# Patient Record
Sex: Male | Born: 1990 | Race: Black or African American | Hispanic: No | Marital: Single | Smoking: Never smoker
Health system: Southern US, Community
[De-identification: ages and names within clinical notes are randomized; demographics above are authoritative.]

## PROBLEM LIST (undated history)

## (undated) DIAGNOSIS — E781 Pure hyperglyceridemia: Secondary | ICD-10-CM

## (undated) DIAGNOSIS — E785 Hyperlipidemia, unspecified: Secondary | ICD-10-CM

## (undated) HISTORY — DX: Pure hyperglyceridemia: E78.1

## (undated) HISTORY — DX: Hyperlipidemia, unspecified: E78.5

---

## 2018-06-08 ENCOUNTER — Emergency Department (HOSPITAL_COMMUNITY): Payer: BLUE CROSS/BLUE SHIELD

## 2018-06-08 ENCOUNTER — Encounter (HOSPITAL_COMMUNITY): Payer: Self-pay | Admitting: Emergency Medicine

## 2018-06-08 ENCOUNTER — Other Ambulatory Visit: Payer: Self-pay

## 2018-06-08 ENCOUNTER — Emergency Department (HOSPITAL_COMMUNITY)
Admission: EM | Admit: 2018-06-08 | Discharge: 2018-06-08 | Disposition: A | Discharge status: Home / Self Care | Payer: BLUE CROSS/BLUE SHIELD | Attending: Emergency Medicine | Admitting: Emergency Medicine

## 2018-06-08 DIAGNOSIS — J111 Influenza due to unidentified influenza virus with other respiratory manifestations: Secondary | ICD-10-CM | POA: Insufficient documentation

## 2018-06-08 DIAGNOSIS — R69 Illness, unspecified: Secondary | ICD-10-CM

## 2018-06-08 DIAGNOSIS — R509 Fever, unspecified: Secondary | ICD-10-CM | POA: Diagnosis present

## 2018-06-08 MED ORDER — ACETAMINOPHEN 325 MG PO TABS
650.0000 mg | ORAL_TABLET | Freq: Once | ORAL | Status: AC
  Administered 2018-06-08: 650 mg via ORAL
  Filled 2018-06-08: qty 2

## 2018-06-08 NOTE — ED Notes (Signed)
Patient transported to X-ray 

## 2018-06-08 NOTE — ED Provider Notes (Signed)
[facility] EMERGENCY DEPARTMENT Provider Note   CSN: [id number] Arrival date & time: 06/08/18  1340    History   Chief Complaint Chief Complaint  Patient presents with  . Fever  . Generalized Body Aches    HPI Chris Rodriguez Chris Rodriguez is a 28 y.o. male.     The history is provided by the patient. No language interpreter was used.  Fever   Chris Rodriguez Chris Rodriguez is a 28 y.o. male who presents to the Emergency Department complaining of fever, body aches. Presents to the emergency department for evaluation of fever, body aches, headache and cough that began on Tuesday of this week. He has subjective fevers, nonproductive cough. He has taken ibuprofen as well as an over-the-counter cold medication with mild improvement in his symptoms. He has no known sick contacts. He did recently travel to [place], but no foreign travel. He has not been around people that have recently had international travel. He denies any nausea, vomiting, diarrhea. He has no medical problems. Symptoms are mild to moderate and constant nature. History reviewed. No pertinent past medical history.  There are no active problems to display for this patient.   History reviewed. No pertinent surgical history.      Home Medications    Prior to Admission medications   Not on File    Family History No family history on file.  Social History Social History   Tobacco Use  . Smoking status: Never Smoker  . Smokeless tobacco: Never Used  Substance Use Topics  . Alcohol use: Never    Frequency: Never  . Drug use: Never     Allergies   Patient has no known allergies.   Review of Systems Review of Systems  Constitutional: Positive for fever.  All other systems reviewed and are negative.    Physical Exam Updated Vital Signs BP 125/86 (BP Location: Right Arm)   Pulse (!) 120   Temp 99.6 F (37.6 C) (Oral)   Resp 20   SpO2 100%   Physical Exam Vitals signs  and nursing note reviewed.  Constitutional:      Appearance: He is well-developed.  HENT:     Head: Normocephalic and atraumatic.     Mouth/Throat:     Mouth: Mucous membranes are moist.     Pharynx: No posterior oropharyngeal erythema.  Cardiovascular:     Rate and Rhythm: Regular rhythm.     Heart sounds: No murmur.     Comments: Tachycardic Pulmonary:     Effort: Pulmonary effort is normal. No respiratory distress.     Breath sounds: Normal breath sounds.  Abdominal:     Palpations: Abdomen is soft.     Tenderness: There is no abdominal tenderness. There is no guarding or rebound.  Musculoskeletal:        General: No tenderness.  Skin:    General: Skin is warm and dry.  Neurological:     Mental Status: He is alert and oriented to person, place, and time.  Psychiatric:        Behavior: Behavior normal.      ED Treatments / Results  Labs (all labs ordered are listed, but only abnormal results are displayed) Labs Reviewed - No data to display  EKG None  Radiology Dg Chest 2 View  Result Date: 06/08/2018 CLINICAL DATA:  Cough and fever EXAM: CHEST - 2 VIEW COMPARISON:  None. FINDINGS: The heart size and mediastinal contours are within normal limits. Both lungs are clear.  The visualized skeletal structures are unremarkable. IMPRESSION: No active cardiopulmonary disease. Electronically Signed   By: Jasmine Pang M.D.   On: 06/08/2018 14:51    Procedures Procedures (including critical care time)  Medications Ordered in ED Medications  acetaminophen (TYLENOL) tablet 650 mg (650 mg Oral Given 06/08/18 1419)     Initial Impression / Assessment and Plan / ED Course  I have reviewed the triage vital signs and the nursing notes.  Pertinent labs & imaging results that were available during my care of the patient were reviewed by me and considered in my medical decision making (see chart for details).        Patient here for evaluation of fever, cough, headache. He is  non-toxic appearing on evaluation with no respiratory distress. Presentation is not consistent with pneumonia, meningitis. Discussed with patient home care for viral illness, symptom management with over-the-counter medications. Discussed outpatient follow-up and return precautions.  Final Clinical Impressions(s) / ED Diagnoses   Final diagnoses:  Influenza-like illness    ED Discharge Orders    None       Tilden Fossa, MD 06/08/18 1505

## 2018-06-08 NOTE — ED Triage Notes (Signed)
Patient reports fevers/chills, generalized body aches, fatigue, h/a, non-productive cough x 3 days. Last took ibuprofen last night. Denies being around anyone sick, no N/V/D, denies shortness of breath or chest pain.

## 2018-06-08 NOTE — ED Notes (Signed)
Pt has returned to room.

## 2019-08-01 ENCOUNTER — Other Ambulatory Visit: Payer: Self-pay

## 2019-08-01 ENCOUNTER — Ambulatory Visit: Payer: BLUE CROSS/BLUE SHIELD | Attending: Internal Medicine

## 2019-08-01 DIAGNOSIS — Z20822 Contact with and (suspected) exposure to covid-19: Secondary | ICD-10-CM

## 2019-08-02 LAB — SARS-COV-2, NAA 2 DAY TAT

## 2019-08-02 LAB — NOVEL CORONAVIRUS, NAA: SARS-CoV-2, NAA: NOT DETECTED

## 2019-08-06 ENCOUNTER — Ambulatory Visit: Payer: BLUE CROSS/BLUE SHIELD | Attending: Internal Medicine

## 2019-08-06 DIAGNOSIS — Z20822 Contact with and (suspected) exposure to covid-19: Secondary | ICD-10-CM

## 2019-08-07 LAB — NOVEL CORONAVIRUS, NAA: SARS-CoV-2, NAA: NOT DETECTED

## 2019-08-07 LAB — SARS-COV-2, NAA 2 DAY TAT

## 2019-10-04 ENCOUNTER — Ambulatory Visit: Payer: BLUE CROSS/BLUE SHIELD | Attending: Internal Medicine

## 2019-10-04 DIAGNOSIS — Z23 Encounter for immunization: Secondary | ICD-10-CM

## 2019-10-04 NOTE — Progress Notes (Signed)
   Covid-19 Vaccination Clinic  Name:  Chris Rodriguez    MRN: [id number] DOB: 1990-08-27  10/04/2019  [name] was observed post Covid-19 immunization for 15 minutes without incident. He was provided with Vaccine Information Sheet and instruction to access the V-Safe system.   [name] was instructed to call 911 with any severe reactions post vaccine: Marland Kitchen Difficulty breathing  . Swelling of face and throat  . A fast heartbeat  . A bad rash all over body  . Dizziness and weakness   Immunizations Administered    Name Date Dose VIS Date Route   Pfizer COVID-19 Vaccine 10/04/2019 10:08 AM 0.3 mL 05/30/2018 Intramuscular   Manufacturer: [employer], [employer]   Lot: [id number]   NDC: [address]-6

## 2019-10-15 IMAGING — CR DG CHEST 2V
2 series · 2 of 2 positions shown · non-contrast
Comparison: None.

CLINICAL DATA: Cough and fever

EXAM:
CHEST - 2 VIEW

[chest pa]
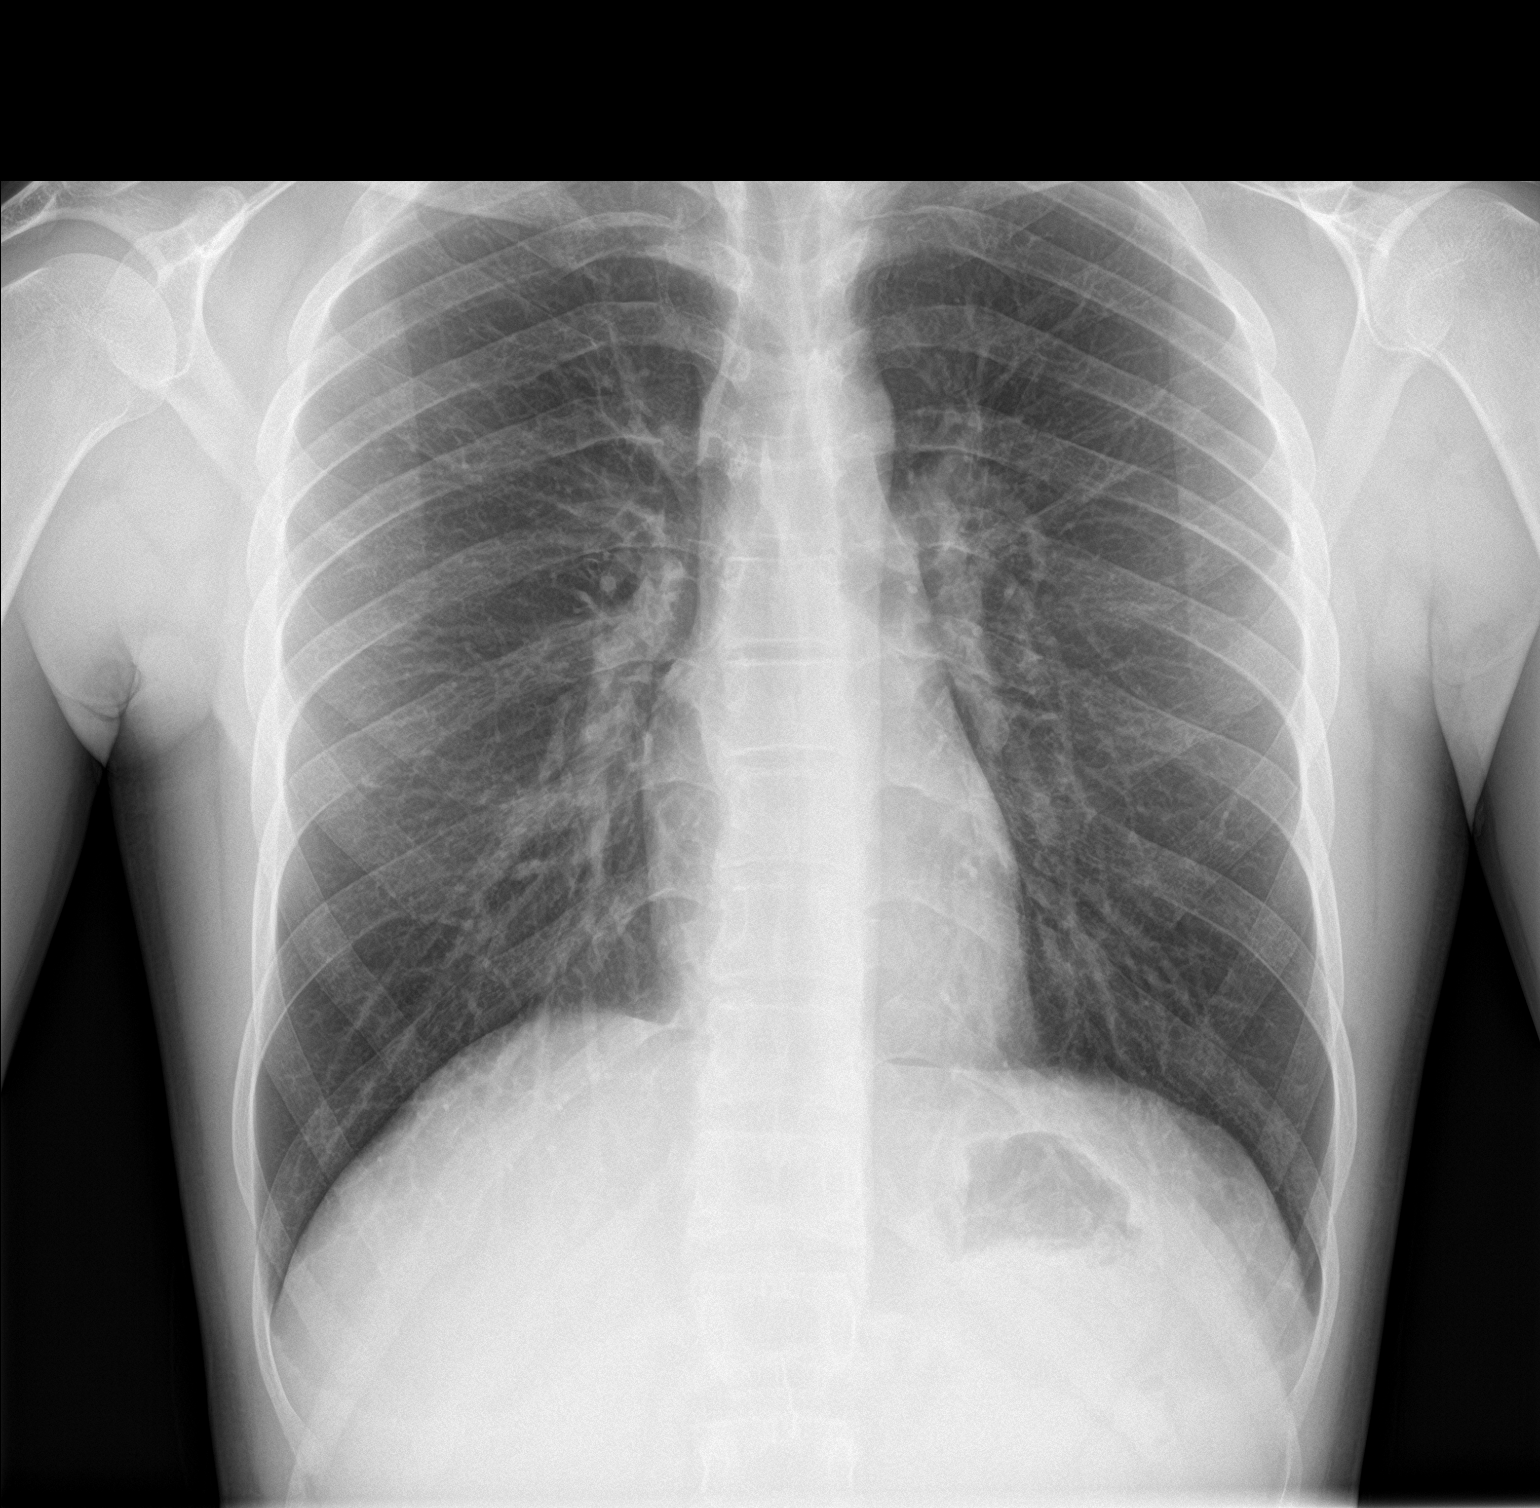

[chest lat]
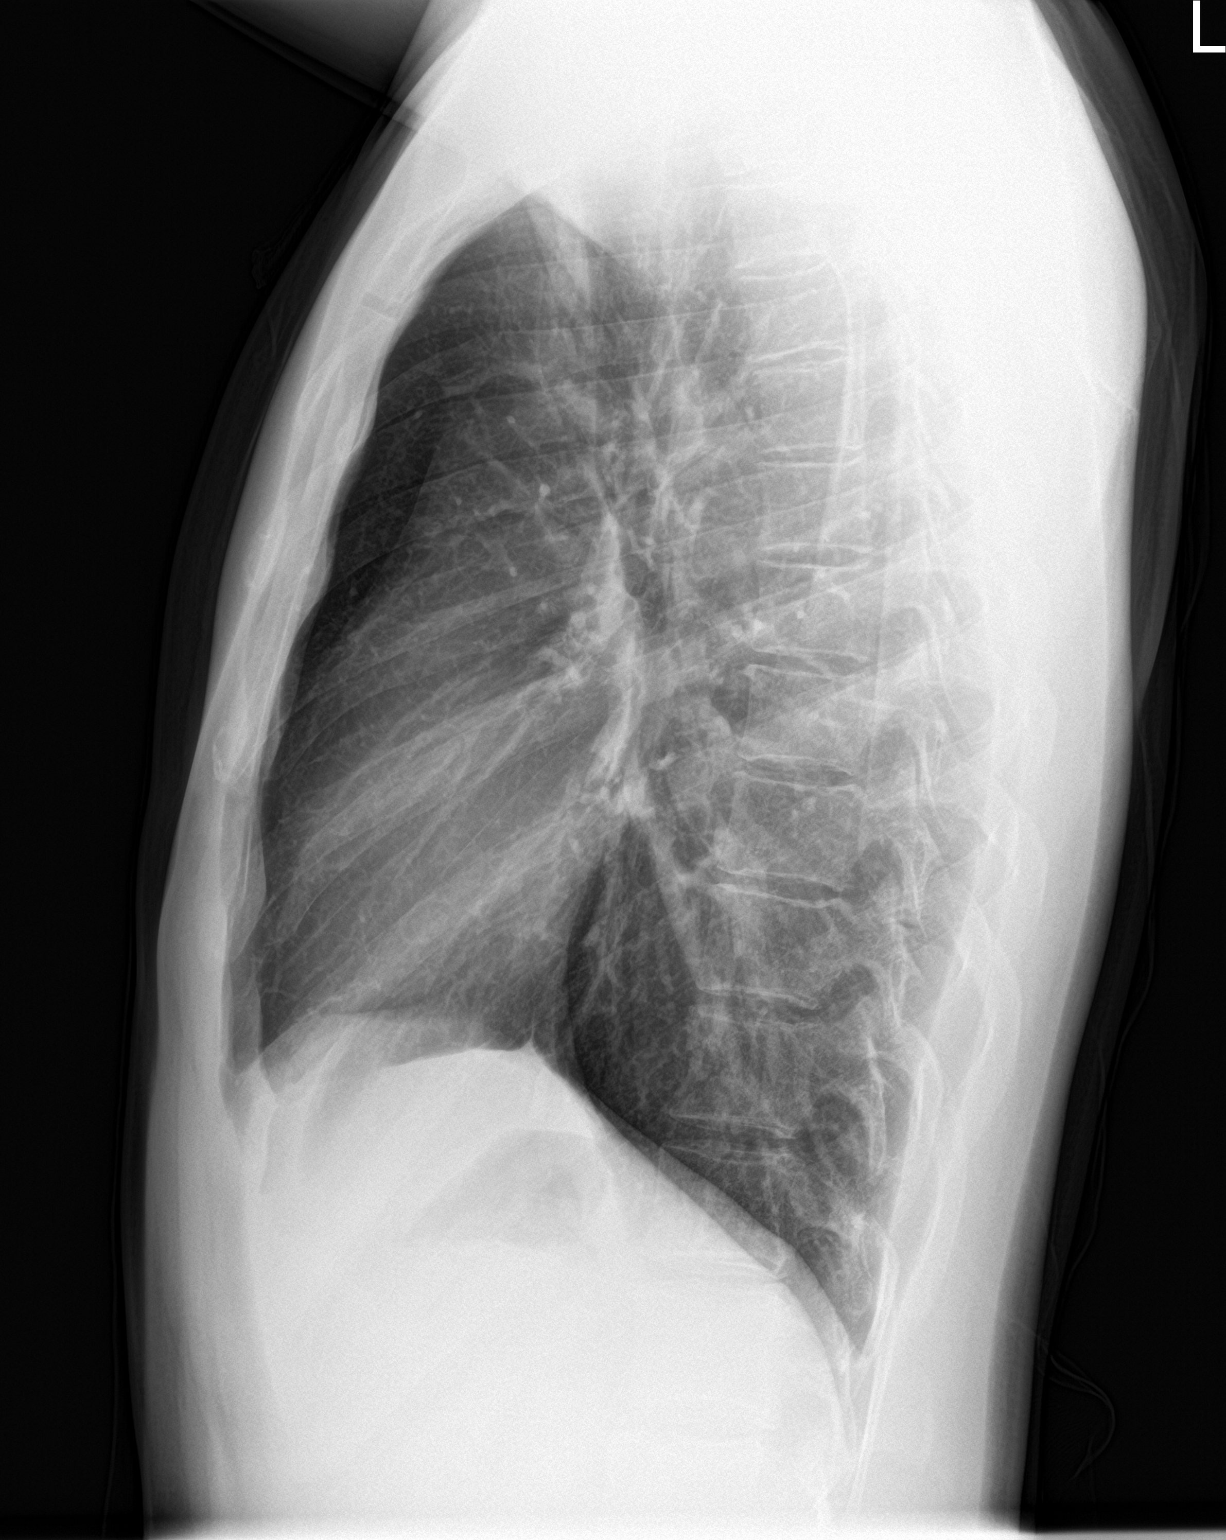

[2 of 2 positions shown; findings below may reference images not displayed]

FINDINGS: The heart size and mediastinal contours are within normal limits.
Both lungs are clear. The visualized skeletal structures are
unremarkable.
IMPRESSION: No active cardiopulmonary disease.

## 2019-10-29 ENCOUNTER — Ambulatory Visit: Payer: BLUE CROSS/BLUE SHIELD

## 2019-10-29 ENCOUNTER — Ambulatory Visit: Payer: BLUE CROSS/BLUE SHIELD | Attending: Internal Medicine

## 2019-10-29 DIAGNOSIS — Z23 Encounter for immunization: Secondary | ICD-10-CM

## 2019-10-29 NOTE — Progress Notes (Signed)
   Covid-19 Vaccination Clinic  Name:  Mubashir Mallek    MRN: [id number] DOB: 1991-04-05  10/29/2019  Mr. Simmering was observed post Covid-19 immunization for 15 minutes without incident. He was provided with Vaccine Information Sheet and instruction to access the V-Safe system.   Mr. Cormier was instructed to call 911 with any severe reactions post vaccine: Marland Kitchen Difficulty breathing  . Swelling of face and throat  . A fast heartbeat  . A bad rash all over body  . Dizziness and weakness   Immunizations Administered    Name Date Dose VIS Date Route   Pfizer COVID-19 Vaccine 10/29/2019 11:54 AM 0.3 mL 05/30/2018 Intramuscular   Manufacturer: [employer], [employer]   Lot: [id number]   NDC: [address]-2

## 2020-04-10 ENCOUNTER — Ambulatory Visit: Payer: Self-pay | Attending: Internal Medicine

## 2020-04-10 DIAGNOSIS — Z23 Encounter for immunization: Secondary | ICD-10-CM

## 2020-04-10 NOTE — Progress Notes (Signed)
   Covid-19 Vaccination Clinic  Name:  Chris Rodriguez    MRN: [id number] DOB: July 17, 1990  04/10/2020  [name] was observed post Covid-19 immunization for 15 minutes without incident. He was provided with Vaccine Information Sheet and instruction to access the V-Safe system.   [name] was instructed to call 911 with any severe reactions post vaccine: Marland Kitchen Difficulty breathing  . Swelling of face and throat  . A fast heartbeat  . A bad rash all over body  . Dizziness and weakness   Immunizations Administered    Name Date Dose VIS Date Route   Pfizer COVID-19 Vaccine 04/10/2020  3:52 PM 0.3 mL 01/23/2020 Intramuscular   Manufacturer: [employer], [employer]   Lot: [id number]   NDC: [address]-1

## 2020-04-18 ENCOUNTER — Other Ambulatory Visit: Payer: Self-pay

## 2021-06-11 ENCOUNTER — Ambulatory Visit: Payer: 59 | Admitting: Cardiology

## 2021-06-11 ENCOUNTER — Other Ambulatory Visit: Payer: Self-pay

## 2021-06-11 ENCOUNTER — Encounter: Payer: Self-pay | Admitting: Cardiology

## 2021-06-11 ENCOUNTER — Inpatient Hospital Stay: Payer: 59

## 2021-06-11 VITALS — BP 123/79 | HR 93 | Temp 97.3°F | Resp 16 | Ht 75.0 in | Wt 161.4 lb

## 2021-06-11 DIAGNOSIS — R002 Palpitations: Secondary | ICD-10-CM

## 2021-06-11 DIAGNOSIS — R9431 Abnormal electrocardiogram [ECG] [EKG]: Secondary | ICD-10-CM

## 2021-06-11 DIAGNOSIS — E781 Pure hyperglyceridemia: Secondary | ICD-10-CM

## 2021-06-11 NOTE — Progress Notes (Signed)
Date:  06/11/2021   ID:  Chris Rodriguez, [place] 08/12/1990, MRN [id number]  Chris Rodriguez:  Chris Rodriguez, No  Cardiologist:  Chris LernerSunit Jossette Zirbel, DO, [facility] (established care 06/11/2021)  REASON FOR CONSULT: Dizziness/giddiness, abnormal EKG  REQUESTING PHYSICIAN:  Chris Rodriguez, [name], MD [id number] [place],  [place] [id number]  Chief Complaint  Patient presents with   Abnormal ECG   Dizziness   New Patient (Initial Visit)    HPI  Chris Rodriguez Chris Rodriguez is a 31 y.o. [place] male who presents to the office with a chief complaint of "dizziness and abnormal EKG." Patient's past medical history and cardiovascular risk factors include: Hypertriglyceridemia, hyperlipidemia.  He is referred to the office at the request of Chris Rodriguez, Chris StallionGeorge, MD for evaluation of dizziness/giddiness, abnormal EKG.  Palpitations:  Palpitations have been present for the last 2 years, occurs at least once a week, more noticeable with long distance driving, duration less than a minute, self-limited.  At times better with deep breathing exercises, no worsening factors.  He consumes 1 cup of coffee per day and 2 cups of tea.  He denies use of sodas, illicit drugs, energy drinks, over-the-counter supplements, herbs, weight loss supplements or other stimulants.  Dizziness: This also occurs once a week, no associated near-syncope or syncopal event.  More noticeable with changing positions.  No improving or worsening factors.  Patient's cholesterol levels are not well controlled.  Upon further questioning patient states that he has been eating out a lot over the last 1 year.  No structured exercise program or daily routine. No history of rheumatic fever.   No family history of premature coronary disease or sudden cardiac death.  FUNCTIONAL STATUS: No structured exercise program or daily routine.   ALLERGIES: No Known Allergies  MEDICATION LIST PRIOR TO VISIT: Current Meds  Medication Sig   Cyanocobalamin  (VITAMIN B 12 PO) Take 1 capsule by mouth as needed.   Omega-3 Fatty Acids (OMEGA-3 FISH OIL PO) Take 1 capsule by mouth daily as needed.     PAST MEDICAL HISTORY: Past Medical History:  Diagnosis Date   Hyperlipidemia    Hypertriglyceridemia     PAST SURGICAL HISTORY: History reviewed. No pertinent surgical history.  FAMILY HISTORY: No family history of premature coronary disease or sudden cardiac death.  SOCIAL HISTORY:  The patient  reports that he has never smoked. He has never used smokeless tobacco. He reports that he does not drink alcohol and does not use drugs.  REVIEW OF SYSTEMS: Review of Systems  Cardiovascular:  Positive for palpitations. Negative for chest pain, claudication, dyspnea on exertion, leg swelling, orthopnea, paroxysmal nocturnal dyspnea and syncope.  Respiratory:  Negative for shortness of breath.   Neurological:  Positive for dizziness and light-headedness.   PHYSICAL EXAM: Vitals with BMI 06/11/2021 06/08/2018 06/08/2018  Height 6\' 3"  - -  Weight 161 lbs 6 oz - -  BMI 20.17 - -  Systolic 123 122 [id number]  Diastolic 79 81 86  Pulse 93 108 120   Orthostatic VS for the past 72 hrs (Last 3 readings):  Orthostatic BP Patient Position BP Location Cuff Size Orthostatic Pulse  06/11/21 1117 125/80 Standing Left Arm Normal 99  06/11/21 1116 126/79 Sitting Left Arm Normal 95  06/11/21 1115 115/78 Supine Left Arm Normal 90    CONSTITUTIONAL: Well-developed and well-nourished. No acute distress.  SKIN: Skin is warm and dry. No rash noted. No cyanosis. No pallor. No jaundice HEAD: Normocephalic and atraumatic.  EYES: No scleral  icterus MOUTH/THROAT: Moist oral membranes.  NECK: No JVD present. No thyromegaly noted. No carotid bruits  LYMPHATIC: No visible cervical adenopathy.  CHEST Normal respiratory effort. No intercostal retractions  LUNGS: Clear to auscultation bilaterally.  No stridor. No wheezes. No rales.  CARDIOVASCULAR: Regular rate and rhythm, positive  S1-S2, no murmurs rubs or gallops appreciated. ABDOMINAL: Soft, nontender, nondistended, positive bowel sounds in all 4 quadrants, no apparent ascites.  EXTREMITIES: No peripheral edema, warm to touch, 2+ bilateral DP and PT pulses HEMATOLOGIC: No significant bruising NEUROLOGIC: Oriented to person, place, and time. Nonfocal. Normal muscle tone.  PSYCHIATRIC: Normal mood and affect. Normal behavior. Cooperative  CARDIAC DATABASE: EKG: 06/11/2021: NSR, 83 bpm, normal axis, combined biatrial enlargement, without underlying ischemia or injury pattern.   Echocardiogram: No results found for this or any previous visit from the past 1095 days.    Stress Testing: No results found for this or any previous visit from the past 1095 days.   Heart Catheterization: None  LABORATORY DATA: External Labs: Collected: 05/21/2021 Total cholesterol 199, triglycerides 268, HDL 40, LDL 121, non-HDL 159. BUN 10, creatinine 0.87. Sodium 139, potassium 4.6, chloride 100, bicarb 29, AST 22, ALT 40, alkaline phosphatase 76. Hemoglobin 15.2 g/dL, hematocrit 82.9%. Platelets 447. Hemoglobin A1c 5.6. TSH 2.06  IMPRESSION:    ICD-10-CM   1. Palpitations  R00.2 LONG TERM MONITOR (3-14 DAYS)    2. Nonspecific abnormal electrocardiogram (ECG) (EKG)  R94.31 EKG 12-Lead    PCV ECHOCARDIOGRAM COMPLETE    3. Hypertriglyceridemia  E78.1        RECOMMENDATIONS: Chris Rodriguez is a 31 y.o. [place] male whose past medical history and cardiac risk factors include: Hypertriglyceridemia, hyperlipidemia.  Palpitations No identifiable reversible cause. External labs provided by Chris Rodriguez independently reviewed and noted above.  Electrolytes, hemoglobin, and TSH within normal limits. 7-day extended Holter monitor to evaluate for dysrhythmias His lightheaded and dizziness likely secondary to orthostasis.  However, orthostatic vital signs negative. Encouraged that he eats 3 heart healthy balanced meals  and keep himself well-hydrated.  Change positions slowly. Further recommendations to follow.  Nonspecific abnormal electrocardiogram (ECG) (EKG) EKG shows normal sinus rhythm with concern for possible biatrial enlargement.  No prior history of rheumatic fever. Plan echo echo will be ordered to evaluate for structural heart disease and left ventricular systolic function.  Hypertriglyceridemia Likely due to dietary indiscretion. Over the last 1 year patient states that he has been eating out almost daily with little to no physical activity. Educated on the importance of reducing the dietary consumption of high cholesterol foods, simple carbohydrates, sweets/pastries. Patient was encouraged to increase physical activity with a goal of 30 minutes a day 5 days a week.  Data Reviewed: I have reviewed external notes provided by the referring provider as well as independent review of labs (summarized above).  I have reviewed the following test results as part of medical decision making: EKG.   I have ordered the following tests: EKG, Holter monitor, echo. I have independently reviewed: today's EKG. I have reconciled medications during today's encounter as noted above.  FINAL MEDICATION LIST END OF ENCOUNTER: No orders of the defined types were placed in this encounter.   There are no discontinued medications.   Current Outpatient Medications:    Cyanocobalamin (VITAMIN B 12 PO), Take 1 capsule by mouth as needed., Disp: , Rfl:    Omega-3 Fatty Acids (OMEGA-3 FISH OIL PO), Take 1 capsule by mouth daily as needed., Disp: , Rfl:   Orders  Placed This Encounter  Procedures   LONG TERM MONITOR (3-14 DAYS)   EKG 12-Lead   PCV ECHOCARDIOGRAM COMPLETE    There are no Patient Instructions on file for this visit.   --Continue cardiac medications as reconciled in final medication list. --Return in about 6 weeks (around 07/23/2021) for Follow up, Palpitations, Review test results. Or sooner if  needed. --Continue follow-up with your primary care physician regarding the management of your other chronic comorbid conditions.  Patient's questions and concerns were addressed to his satisfaction. He voices understanding of the instructions provided during this encounter.   This note was created using a voice recognition software as a result there may be grammatical errors inadvertently enclosed that do not reflect the nature of this encounter. Every attempt is made to correct such errors.  Chris Rodriguez, [place], [facility]  Pager: [phone] Office: [phone]

## 2021-07-16 ENCOUNTER — Other Ambulatory Visit: Payer: 59

## 2021-07-16 ENCOUNTER — Ambulatory Visit: Payer: 59

## 2021-07-16 DIAGNOSIS — R9431 Abnormal electrocardiogram [ECG] [EKG]: Secondary | ICD-10-CM

## 2021-07-23 ENCOUNTER — Ambulatory Visit: Payer: 59 | Admitting: Cardiology

## 2021-07-23 ENCOUNTER — Encounter: Payer: Self-pay | Admitting: Cardiology

## 2021-07-23 VITALS — BP 114/73 | HR 81 | Temp 97.4°F | Resp 16 | Ht 75.0 in | Wt 158.4 lb

## 2021-07-23 DIAGNOSIS — E781 Pure hyperglyceridemia: Secondary | ICD-10-CM

## 2021-07-23 DIAGNOSIS — Z712 Person consulting for explanation of examination or test findings: Secondary | ICD-10-CM

## 2021-07-23 DIAGNOSIS — R002 Palpitations: Secondary | ICD-10-CM

## 2021-07-23 NOTE — Progress Notes (Signed)
? ?Date:  07/23/2021  ? ?[facility]  [facility] [place], [place] 03/07/1991, MRN [id number] ? ?PCP:  [name], [name], MD  ?Cardiologist:  Tessa LernerSunit Morganne Haile, DO, [facility] (established care 06/11/2021) ? ?Date: 07/23/21 ?Last Office Visit: 06/11/2021 ? ?Chief Complaint  ?Patient presents with  ? Palpitations  ? Results  ? Follow-up  ? ? ?HPI  ?Chris Rodriguez is a 31 y.o. [place] male whose past medical history and cardiovascular risk factors include: Hypertriglyceridemia, hyperlipidemia. ? ?He is referred to the office at the request of No ref. provider found for evaluation of dizziness/giddiness, abnormal EKG. ? ?Patient presented to the office for evaluation of palpitations.  No identifiable reversible cause identified and based on external Labs electrolytes/hemoglobin/TSH values were within normal limits.  He underwent a 7-day extended Holter monitor which noted no significant dysrhythmias and underlying rhythm sinus.  Since last office visit patient states that he is consuming 3 balanced meals and keeping himself well-hydrated and has not had any recurrences of palpitations. ? ?Patient is aware of his hypertriglycerides levels being not well controlled.  He is attributing this to eating outside consistently over the last 1 year.  However he is implementing lifestyle changes we will follow-up with PCP. ? ?No family history of premature coronary disease or sudden cardiac death. ? ?FUNCTIONAL STATUS: ?No structured exercise program or daily routine.  ? ?ALLERGIES: ?No Known Allergies ? ?MEDICATION LIST PRIOR TO VISIT: ?Current Meds  ?Medication Sig  ? Cyanocobalamin (VITAMIN B 12 PO) Take 1 capsule by mouth as needed.  ? Omega-3 Fatty Acids (OMEGA-3 FISH OIL PO) Take 1 capsule by mouth daily as needed.  ?  ? ?PAST MEDICAL HISTORY: ?Past Medical History:  ?Diagnosis Date  ? Hyperlipidemia   ? Hypertriglyceridemia   ? ? ?PAST SURGICAL HISTORY: ?History reviewed. No pertinent surgical history. ? ?FAMILY  HISTORY: ?No family history of premature coronary disease or sudden cardiac death. ? ?SOCIAL HISTORY:  ?The patient  reports that he has never smoked. He has never used smokeless tobacco. He reports that he does not drink alcohol and does not use drugs. ? ?REVIEW OF SYSTEMS: ?Review of Systems  ?Cardiovascular:  Negative for chest pain, claudication, dyspnea on exertion, leg swelling, orthopnea, palpitations, paroxysmal nocturnal dyspnea and syncope.  ?Respiratory:  Negative for shortness of breath.   ?Neurological:  Negative for dizziness and light-headedness.  ? ?PHYSICAL EXAM: ? ?  07/23/2021  ? 11:25 AM 06/11/2021  ? 11:09 AM 06/08/2018  ?  3:24 PM  ?Vitals with BMI  ?Height 6\' 3"  6\' 3"    ?Weight 158 lbs 6 oz 161 lbs 6 oz   ?BMI 19.8 20.17   ?Systolic 114 123 [id number]  ?Diastolic 73 79 81  ?Pulse 81 93 108  ? ?Comment EKG ?CONSTITUTIONAL: Well-developed and well-nourished. No acute distress.  ?SKIN: Skin is warm and dry. No rash noted. No cyanosis. No pallor. No jaundice ?HEAD: Normocephalic and atraumatic.  ?EYES: No scleral icterus ?MOUTH/THROAT: Moist oral membranes.  ?NECK: No JVD present. No thyromegaly noted. No carotid bruits  ?LYMPHATIC: No visible cervical adenopathy.  ?CHEST Normal respiratory effort. No intercostal retractions  ?LUNGS: Clear to auscultation bilaterally.  No stridor. No wheezes. No rales.  ?CARDIOVASCULAR: Regular rate and rhythm, positive S1-S2, no murmurs rubs or gallops appreciated. ?ABDOMINAL: Soft, nontender, nondistended, positive bowel sounds in all 4 quadrants, no apparent ascites.  ?EXTREMITIES: No peripheral edema, warm to touch, 2+ bilateral DP and PT pulses ?HEMATOLOGIC: No significant bruising ?NEUROLOGIC: Oriented to person, place, and time.  Nonfocal. Normal muscle tone.  ?PSYCHIATRIC: Normal mood and affect. Normal behavior. Cooperative ?No significant change in physical examinations last office visit ? ?CARDIAC DATABASE: ?EKG: ?06/11/2021: NSR, 83 bpm, normal axis, combined  biatrial enlargement, without underlying ischemia or injury pattern.  ? ?Echocardiogram: ?07/16/2021:  ?Normal LV systolic function with visual EF 60-65%. Left ventricle cavity is normal in size. Normal left ventricular wall thickness. Normal global wall motion. Normal diastolic filling pattern, normal LAP.  ?No significant valvular abnormalities.  ?No prior study for comparison. ?  ?Stress Testing: ?No results found for this or any previous visit from the past 1095 days. ? ? ?Heart Catheterization: ?None ? ?7 day extended Holter monitor: ?Dominant rhythm normal sinus. ?Heart rate 47-195 bpm.  Avg HR 82 bpm. ?No atrial fibrillation, supraventricular tachycardia, ventricular tachycardia, high grade AV block, pauses (3 seconds or longer). ?Total ventricular ectopic burden <1%. ?Total supraventricular ectopic burden <1%. ?Patient triggered events: 16.  These did not correlate with any significant dysrhythmias. ? ?LABORATORY DATA: ?External Labs: ?Collected: 05/21/2021 ?Total cholesterol 199, triglycerides 268, HDL 40, LDL 121, non-HDL 159. ?BUN 10, creatinine 0.87. ?Sodium 139, potassium 4.6, chloride 100, bicarb 29, ?AST 22, ALT 40, alkaline phosphatase 76. ?Hemoglobin 15.2 g/dL, hematocrit 56.3%. ?Platelets 447. ?Hemoglobin A1c 5.6. ?TSH 2.06 ? ?IMPRESSION: ? ?  ICD-10-CM   ?1. Palpitations  R00.2   ?  ?2. Hypertriglyceridemia  E78.1   ?  ?3. Encounter to discuss test results  Z71.2   ?  ?  ? ?RECOMMENDATIONS: ?Chris Rodriguez is a 31 y.o. [place] male whose past medical history and cardiac risk factors include: Hypertriglyceridemia, hyperlipidemia. ? ?Patient was referred to the practice for evaluation of palpitations.  No identifiable reversible causes based on laboratory data and therefore underwent an extended Holter monitor to evaluate for any dysrhythmias or conduction disease.  The Holter monitor noted underlying rhythm to be normal and no significant dysrhythmias.  Clinically doing well from a  cardiovascular standpoint last office visit.  Patient states that his palpitations, lightheaded and dizziness symptoms have all resolved. ? ?During initial consultation he was also noted to have hypertriglyceridemia.  He is attributing this to his lifestyle as he has been eating out majority of the past 1 year.  This is currently being managed by his PCP, per patient. ? ?From a cardiovascular standpoint no additional cardiovascular testing is warranted and I will see him back on as-needed basis. ? ?Independently reviewed the results of the echo and extended Holter monitor with the patient at today's office visit and noted above for further reference. ? ?Total time spent: 20 minutes. ? ?FINAL MEDICATION LIST END OF ENCOUNTER: ?No orders of the defined types were placed in this encounter. ?  ?There are no discontinued medications.  ? ?Current Outpatient Medications:  ?  Cyanocobalamin (VITAMIN B 12 PO), Take 1 capsule by mouth as needed., Disp: , Rfl:  ?  Omega-3 Fatty Acids (OMEGA-3 FISH OIL PO), Take 1 capsule by mouth daily as needed., Disp: , Rfl:  ? ?No orders of the defined types were placed in this encounter. ? ? ?There are no Patient Instructions on file for this visit.  ? ?--Continue cardiac medications as reconciled in final medication list. ?--Return if symptoms worsen or fail to improve. Or sooner if needed. ?--Continue follow-up with your primary care physician regarding the management of your other chronic comorbid conditions. ? ?Patient's questions and concerns were addressed to his satisfaction. He voices understanding of the instructions provided during this encounter.  ? ?  This note was created using a voice recognition software as a result there may be grammatical errors inadvertently enclosed that do not reflect the nature of this encounter. Every attempt is made to correct such errors. ? ?Tessa Lerner, DO, FACC ? ?Pager: [phone] ?Office: [phone] ? ? ?

## 2022-05-29 LAB — AMB RESULTS CONSOLE CBG: Glucose: 86

## 2022-05-29 NOTE — Progress Notes (Unsigned)
Pt did not answer second question on housing

## 2022-06-08 ENCOUNTER — Encounter: Payer: Self-pay | Admitting: *Deleted

## 2022-06-08 NOTE — Progress Notes (Signed)
Pt attended 05/29/22 screening event where screening results wnl - PCP listed in chart but no visits noted over past year or currently. Attempted to call pt but no VM available on first try. Dr, [name]'s office does confirm pt is an established pt with [facility] and Dr. Orma Render- no SDOH barriers documented at the time of the event. No additional health equity team support indicated at this time.

## 2022-06-08 NOTE — Progress Notes (Signed)
F/u completed on 06/08/22

## 2023-05-02 ENCOUNTER — Emergency Department (HOSPITAL_BASED_OUTPATIENT_CLINIC_OR_DEPARTMENT_OTHER)
Admission: EM | Admit: 2023-05-02 | Discharge: 2023-05-02 | Disposition: A | Discharge status: Home / Self Care | Payer: Medicaid Other | Attending: Emergency Medicine | Admitting: Emergency Medicine

## 2023-05-02 ENCOUNTER — Encounter (HOSPITAL_BASED_OUTPATIENT_CLINIC_OR_DEPARTMENT_OTHER): Payer: Self-pay | Admitting: Emergency Medicine

## 2023-05-02 ENCOUNTER — Other Ambulatory Visit: Payer: Self-pay

## 2023-05-02 DIAGNOSIS — K0889 Other specified disorders of teeth and supporting structures: Secondary | ICD-10-CM | POA: Diagnosis present

## 2023-05-02 MED ORDER — ACETAMINOPHEN 500 MG PO TABS
1000.0000 mg | ORAL_TABLET | Freq: Once | ORAL | Status: AC
  Administered 2023-05-02: 1000 mg via ORAL
  Filled 2023-05-02: qty 2

## 2023-05-02 MED ORDER — LIDOCAINE VISCOUS HCL 2 % MT SOLN
15.0000 mL | Freq: Once | OROMUCOSAL | Status: AC
  Administered 2023-05-02: 15 mL via OROMUCOSAL
  Filled 2023-05-02: qty 15

## 2023-05-02 MED ORDER — PENICILLIN V POTASSIUM 500 MG PO TABS: 500.0000 mg | ORAL_TABLET | Freq: Four times a day (QID) | ORAL | 0 refills | Status: AC

## 2023-05-02 MED ORDER — LIDOCAINE VISCOUS HCL 2 % MT SOLN: 5.0000 mL | Freq: Three times a day (TID) | OROMUCOSAL | 0 refills | Status: DC

## 2023-05-02 MED ORDER — PENICILLIN V POTASSIUM 250 MG PO TABS
500.0000 mg | ORAL_TABLET | Freq: Once | ORAL | Status: AC
  Administered 2023-05-02: 500 mg via ORAL
  Filled 2023-05-02: qty 2

## 2023-05-02 NOTE — ED Provider Notes (Signed)
[facility] Provider Note   CSN: [id number] Arrival date & time: 05/02/23  0116     History  Chief Complaint  Patient presents with   Dental Pain    Chris Rodriguez is a 33 y.o. male.  The history is provided by the patient.  Dental Pain Location:  Upper Upper teeth location:  14/LU 1st molar      Home Medications Prior to Admission medications   Medication Sig Start Date End Date Taking? Authorizing Provider  magic mouthwash (lidocaine, diphenhydrAMINE, alum & mag hydroxide) suspension Swish and spit 5 mLs 3 (three) times daily. 05/02/23  Yes Peri Kreft, MD  penicillin v potassium (VEETID) 500 MG tablet Take 1 tablet (500 mg total) by mouth 4 (four) times daily for 7 days. 05/02/23 05/09/23 Yes Dameer Speiser, MD  Cyanocobalamin (VITAMIN B 12 PO) Take 1 capsule by mouth as needed.    [provider]  Omega-3 Fatty Acids (OMEGA-3 FISH OIL PO) Take 1 capsule by mouth daily as needed.    [provider]      Allergies    Patient has no known allergies.    Review of Systems   Review of Systems  Physical Exam Updated Vital Signs BP (!) 127/92 (BP Location: Right Arm)   Pulse 61   Temp 98.2 F (36.8 C) (Oral)   Resp 20   Ht 6\' 3"  (1.905 m)   Wt 74.8 kg   SpO2 99%   BMI 20.62 kg/m  Physical Exam Constitutional:      General: He is not in acute distress.    Appearance: He is well-developed. He is not diaphoretic.  HENT:     Head: Normocephalic and atraumatic.     Mouth/Throat:     Comments: Upper dental caries Eyes:     Conjunctiva/sclera: Conjunctivae normal.     Pupils: Pupils are equal, round, and reactive to light.  Cardiovascular:     Rate and Rhythm: Normal rate and regular rhythm.  Pulmonary:     Effort: Pulmonary effort is normal.     Breath sounds: Normal breath sounds. No wheezing or rales.  Abdominal:     General: Bowel sounds are normal.     Palpations: Abdomen is  soft.     Tenderness: There is no abdominal tenderness. There is no guarding or rebound.  Musculoskeletal:        General: Normal range of motion.     Cervical back: Normal range of motion and neck supple.  Skin:    General: Skin is warm and dry.  Neurological:     Mental Status: He is alert and oriented to person, place, and time.     ED Results / Procedures / Treatments   Labs (all labs ordered are listed, but only abnormal results are displayed) Labs Reviewed - No data to display  EKG None  Radiology No results found.  Procedures Procedures    Medications Ordered in ED Medications  penicillin v potassium (VEETID) tablet 500 mg (has no administration in time range)  lidocaine (XYLOCAINE) 2 % viscous mouth solution 15 mL (has no administration in time range)  acetaminophen (TYLENOL) tablet 1,000 mg (1,000 mg Oral Given 05/02/23 0217)    ED Course/ Medical Decision Making/ A&P                                 Medical Decision Making Dental  pain saw a dentist months ago still has pain   Amount and/or Complexity of Data Reviewed External Data Reviewed: notes.    Details: Previous notes reviewed   Risk OTC drugs. Prescription drug management. Risk Details: No facial swelling.  WIll start antibiotics and refer back to dentistry for definitive care.  Stable for discharge.      Final Clinical Impression(s) / ED Diagnoses Final diagnoses:  Pain, dental  I have reviewed the triage vital signs and the nursing notes. Pertinent labs & imaging results that were available during my care of the patient were reviewed by me and considered in my medical decision making (see chart for details). After history, exam, and medical workup I feel the patient has been appropriately medically screened and is safe for discharge home. Pertinent diagnoses were discussed with the patient. Patient was given return precautions.    Rx / DC Orders ED Discharge Orders          Ordered     penicillin v potassium (VEETID) 500 MG tablet  4 times daily        05/02/23 0309    magic mouthwash (lidocaine, diphenhydrAMINE, alum & mag hydroxide) suspension  3 times daily        05/02/23 0309              Kinberly Perris, MD 05/02/23 1610

## 2023-05-02 NOTE — ED Triage Notes (Signed)
Dental pain in L side in a tooth that was filled ~2 months ago. Pain started yesterday at 1600. Pt reports taking ibuprofen, most recently ~1 hour ago (1600 mg). Denies taking any tylenol. Pt has a dentist that he can follow up with.

## 2023-05-28 ENCOUNTER — Inpatient Hospital Stay: Payer: Medicaid Other | Attending: Hematology and Oncology | Admitting: Hematology and Oncology

## 2023-05-28 ENCOUNTER — Telehealth: Payer: Self-pay

## 2023-05-28 NOTE — Telephone Encounter (Signed)
 Placed call to pt regarding Burke/NS to his new heme appt today. LVM for call back to r/s.

## 2023-05-30 ENCOUNTER — Encounter (HOSPITAL_BASED_OUTPATIENT_CLINIC_OR_DEPARTMENT_OTHER): Payer: Self-pay | Admitting: Emergency Medicine

## 2023-06-27 ENCOUNTER — Inpatient Hospital Stay: Payer: Medicaid Other

## 2023-06-27 ENCOUNTER — Inpatient Hospital Stay: Payer: Medicaid Other | Attending: Hematology and Oncology | Admitting: Hematology and Oncology

## 2023-06-27 ENCOUNTER — Other Ambulatory Visit: Payer: Self-pay

## 2023-06-27 VITALS — BP 114/73 | HR 81 | Temp 98.2°F | Resp 15 | Wt 161.2 lb

## 2023-06-27 DIAGNOSIS — D75839 Thrombocytosis, unspecified: Secondary | ICD-10-CM

## 2023-06-27 DIAGNOSIS — D75838 Other thrombocytosis: Secondary | ICD-10-CM | POA: Diagnosis present

## 2023-06-27 LAB — IRON AND IRON BINDING CAPACITY (CC-WL,HP ONLY)
Iron: 53 ug/dL (ref 45–182)
Saturation Ratios: 15 % — ABNORMAL LOW (ref 17.9–39.5)
TIBC: 354 ug/dL (ref 250–450)
UIBC: 301 ug/dL (ref 117–376)

## 2023-06-27 LAB — CMP (CANCER CENTER ONLY)
ALT: 20 U/L (ref 0–44)
AST: 18 U/L (ref 15–41)
Albumin: 4.5 g/dL (ref 3.5–5.0)
Alkaline Phosphatase: 55 U/L (ref 38–126)
Anion gap: 4 — ABNORMAL LOW (ref 5–15)
BUN: 13 mg/dL (ref 6–20)
CO2: 31 mmol/L (ref 22–32)
Calcium: 9.6 mg/dL (ref 8.9–10.3)
Chloride: 102 mmol/L (ref 98–111)
Creatinine: 0.82 mg/dL (ref 0.61–1.24)
GFR, Estimated: >60 mL/min (ref >60)
Glucose, Bld: 96 mg/dL (ref 70–99)
Potassium: 3.9 mmol/L (ref 3.5–5.1)
Sodium: 137 mmol/L (ref 135–145)
Total Bilirubin: 0.5 mg/dL (ref 0.0–1.2)
Total Protein: 7.8 g/dL (ref 6.5–8.1)

## 2023-06-27 LAB — RETIC PANEL
Immature Retic Fract: 3.8 % (ref 2.3–15.9)
RBC.: 4.72 MIL/uL (ref 4.22–5.81)
Retic Count, Absolute: 34.5 10*3/uL (ref 19.0–186.0)
Retic Ct Pct: 0.7 % (ref 0.4–3.1)
Reticulocyte Hemoglobin: 32.8 pg (ref >27.9)

## 2023-06-27 LAB — CBC WITH DIFFERENTIAL (CANCER CENTER ONLY)
Abs Immature Granulocytes: 0.01 10*3/uL (ref 0.00–0.07)
Basophils Absolute: 0.1 10*3/uL (ref 0.0–0.1)
Basophils Relative: 1 %
Eosinophils Absolute: 0.5 10*3/uL (ref 0.0–0.5)
Eosinophils Relative: 8 %
HCT: 42.9 % (ref 39.0–52.0)
Hemoglobin: 14 g/dL (ref 13.0–17.0)
Immature Granulocytes: 0 %
Lymphocytes Relative: 33 %
Lymphs Abs: 2 10*3/uL (ref 0.7–4.0)
MCH: 29.8 pg (ref 26.0–34.0)
MCHC: 32.6 g/dL (ref 30.0–36.0)
MCV: 91.3 fL (ref 80.0–100.0)
Monocytes Absolute: 0.6 10*3/uL (ref 0.1–1.0)
Monocytes Relative: 9 %
Neutro Abs: 2.9 10*3/uL (ref 1.7–7.7)
Neutrophils Relative %: 49 %
Platelet Count: 425 10*3/uL — ABNORMAL HIGH (ref 150–400)
RBC: 4.7 MIL/uL (ref 4.22–5.81)
RDW: 11.9 % (ref 11.5–15.5)
WBC Count: 6.1 10*3/uL (ref 4.0–10.5)
nRBC: 0 % (ref 0.0–0.2)

## 2023-06-27 LAB — SEDIMENTATION RATE: Sed Rate: 25 mm/h — ABNORMAL HIGH (ref 0–16)

## 2023-06-27 LAB — C-REACTIVE PROTEIN: CRP: 0.7 mg/dL (ref <1.0)

## 2023-06-27 NOTE — Progress Notes (Signed)
 [facility] [phone]   [phone]  INITIAL CONSULT NOTE  Patient Care Team: Jackie Plum, MD as PCP - General (Internal Medicine)  Hematological/Oncological History # Thrombocytosis 05/03/2023: WBC 5.7, Hgb 15.5. MCV 88.4, Plt 489 06/27/2023: establish care with Dr. Leonides Schanz   CHIEF COMPLAINTS/PURPOSE OF CONSULTATION:  "Thrombocytosis "  HISTORY OF PRESENTING ILLNESS:  Chris Rodriguez 33 y.o. male with medical history significant for hyperlipidemia who presents for evaluation of thrombocytosis.  On review of the previous records Chris Rodriguez had labs drawn on 05/03/2023 which showed WBC 5.7, Hgb 15.5. MCV 88.4, Plt 489.  Due to concern for these findings the patient was referred to hematology for further evaluation and management.  On exam today Chris Rodriguez reports that he has a strong family history of blood clots.  His father had a blood clot in his foot in August 02, 1990 and subsequently passed away in 08-01-2021 when he developed a "blood clot in the small intestines".  He reports his brother has also developed blood clots recently.  He has 4 other brothers were healthy and his mother is healthy.  He reports that he has not had any trouble with bleeding, bruising, or dark stools.  He notes he enjoys eating red meat at least once per week.  He also enjoys eating green leafy vegetables.  The only meat he does not eat as pork.  He reports that he does have a tooth that is hurting him in the upper left side of his mouth which he is visiting a dentist for tomorrow.  He reports he has no inflammatory symptoms such as rash, joint pain, or other rheumatological symptoms.  On further discussion he reports that he is a never smoker never drinker and currently works for [employer]  He notes that he is otherwise at his baseline level of health with no fevers, chills, sweats, nausea, vomiting or diarrhea.  Full 10 point ROS is otherwise negative.  MEDICAL HISTORY:   Past Medical History:  Diagnosis Date   Hyperlipidemia    Hypertriglyceridemia     SURGICAL HISTORY: No past surgical history on file.  SOCIAL HISTORY: Social History   Socioeconomic History   Marital status: Single    Spouse name: Not on file   Number of children: Not on file   Years of education: Not on file   Highest education level: Not on file  Occupational History   Not on file  Tobacco Use   Smoking status: Never   Smokeless tobacco: Never  Vaping Use   Vaping status: Never Used  Substance and Sexual Activity   Alcohol use: Never   Drug use: Never   Sexual activity: Not on file  Other Topics Concern   Not on file  Social History Narrative   ** Merged History Encounter **       Social Drivers of Health   Financial Resource Strain: Not on file  Food Insecurity: No Food Insecurity (05/29/2022)   Hunger Vital Sign    Worried About Running Out of Food in the Last Year: Never true    Ran Out of Food in the Last Year: Never true  Transportation Needs: No Transportation Needs (05/29/2022)   PRAPARE - Administrator, Civil Service (Medical): No    Lack of Transportation (Non-Medical): No  Physical Activity: Not on file  Stress: Not on file  Social Connections: Not on file  Intimate Partner Violence: Not At Risk (05/29/2022)   Humiliation, Afraid, Rape, and Kick  questionnaire    Fear of Current or Ex-Partner: No    Emotionally Abused: No    Physically Abused: No    Sexually Abused: No    FAMILY HISTORY: No family history on file.  ALLERGIES:  has no allergies on file.  MEDICATIONS:  No current outpatient medications on file.   No current facility-administered medications for this visit.    REVIEW OF SYSTEMS:   Constitutional: ( - ) fevers, ( - )  chills , ( - ) night sweats Eyes: ( - ) blurriness of vision, ( - ) double vision, ( - ) watery eyes Ears, nose, mouth, throat, and face: ( - ) mucositis, ( - ) sore throat Respiratory: ( - )  cough, ( - ) dyspnea, ( - ) wheezes Cardiovascular: ( - ) palpitation, ( - ) chest discomfort, ( - ) lower extremity swelling Gastrointestinal:  ( - ) nausea, ( - ) heartburn, ( - ) change in bowel habits Skin: ( - ) abnormal skin rashes Lymphatics: ( - ) new lymphadenopathy, ( - ) easy bruising Neurological: ( - ) numbness, ( - ) tingling, ( - ) new weaknesses Behavioral/Psych: ( - ) mood change, ( - ) new changes  All other systems were reviewed with the patient and are negative.  PHYSICAL EXAMINATION:  Vitals:   06/27/23 1319  BP: 114/73  Pulse: 81  Resp: 15  Temp: 98.2 F (36.8 C)  SpO2: 100%   Filed Weights   06/27/23 1319  Weight: 161 lb 3.2 oz (73.1 kg)    GENERAL: well appearing young African-American male in NAD  SKIN: skin color, texture, turgor are normal, no rashes or significant lesions EYES: conjunctiva are pink and non-injected, sclera clear LUNGS: clear to auscultation and percussion with normal breathing effort HEART: regular rate & rhythm and no murmurs and no lower extremity edema Musculoskeletal: no cyanosis of digits and no clubbing  PSYCH: alert & oriented x 3, fluent speech NEURO: no focal motor/sensory deficits  LABORATORY DATA:  I have reviewed the data as listed    Latest Ref Rng & Units 06/27/2023    2:51 PM  CBC  WBC 4.0 - 10.5 K/uL 6.1   Hemoglobin 13.0 - 17.0 g/dL 91.4   Hematocrit 78.2 - 52.0 % 42.9   Platelets 150 - 400 K/uL 425        Latest Ref Rng & Units 06/27/2023    2:51 PM  CMP  Glucose 70 - 99 mg/dL 96   BUN 6 - 20 mg/dL 13   Creatinine 9.56 - 1.24 mg/dL 2.13   Sodium 086 - 578 mmol/L 137   Potassium 3.5 - 5.1 mmol/L 3.9   Chloride 98 - 111 mmol/L 102   CO2 22 - 32 mmol/L 31   Calcium 8.9 - 10.3 mg/dL 9.6   Total Protein 6.5 - 8.1 g/dL 7.8   Total Bilirubin 0.0 - 1.2 mg/dL 0.5   Alkaline Phos 38 - 126 U/L 55   AST 15 - 41 U/L 18   ALT 0 - 44 U/L 20      ASSESSMENT & PLAN Chris Rodriguez 33 y.o.  male with medical history significant for hyperlipidemia who presents for evaluation of thrombocytosis.  After review of the labs, review of the records, and discussion with the patient the patients findings are most consistent with thrombocytosis of unclear etiology.  There are two types of thrombocytosis, essential thrombocytosis and secondary thrombocytosis. Essential thrombocytosis is overproduction of platelets due to  a driver mutation. The most common mutation is the JAK2 V617F (50% of cases), but other causes include CALR, MPL, and mutations of unclear significance on NGS. Essential thrombocytosis is a myeloproliferative neoplasm which may require cytoreductive therapy do decrease risk of thrombosis. Secondary thrombocytosis can be caused by low iron levels, increased inflammation,  or splenectomy.  Our workup will focus on determining if there is a secondary cause of this patient's thrombocytosis.    # Thrombocytosis, Unclear Etiology  --workup to include CBC, CMP, ESR/CRP, and iron panel/ferritin  --patient has no history of splenectomy     --will order MPN workup to include JAK2 with reflex and BCR/ABL FISH  --RTC in 3 months or sooner if indicated by the above labs.     Orders Placed This Encounter  Procedures   NGS JAK2 E12-15/CALR/MPL   BCR-ABL1 FISH    Standing Status:   Future    Number of Occurrences:   1    Expiration Date:   06/26/2024   CBC with Differential (Cancer Center Only)    Standing Status:   Future    Number of Occurrences:   1    Expiration Date:   06/26/2024   CMP (Cancer Center only)    Standing Status:   Future    Number of Occurrences:   1    Expiration Date:   06/26/2024   Ferritin    Standing Status:   Future    Number of Occurrences:   1    Expiration Date:   06/26/2024   Iron and Iron Binding Capacity ([facility] only)    Standing Status:   Future    Number of Occurrences:   1    Expiration Date:   06/26/2024   Retic Panel    Standing Status:    Future    Number of Occurrences:   1    Expiration Date:   06/26/2024   Sedimentation rate    Standing Status:   Future    Number of Occurrences:   1    Expiration Date:   06/26/2024   C-reactive protein    Standing Status:   Future    Number of Occurrences:   1    Expiration Date:   06/26/2024    All questions were answered. The patient knows to call the clinic with any problems, questions or concerns.  A total of more than 45 minutes were spent on this encounter with face-to-face time and non-face-to-face time, including preparing to see the patient, ordering tests and/or medications, counseling the patient and coordination of care as outlined above.   Ulysees Barns, MD Department of Hematology/Oncology [facility] Phone: [phone] Pager: [phone] Email: [email or web address]  06/27/2023 4:52 PM

## 2023-06-28 ENCOUNTER — Telehealth: Payer: Self-pay | Admitting: Hematology and Oncology

## 2023-06-28 LAB — FERRITIN: Ferritin: 43 ng/mL (ref 24–336)

## 2023-06-28 NOTE — Telephone Encounter (Signed)
 Left detailed message of appt details. Informed patient to call back if appointments do not work.

## 2023-07-01 LAB — BCR-ABL1 FISH
Cells Analyzed: 200
Cells Counted: 200

## 2023-07-04 LAB — NGS JAK2 E12-15/CALR/MPL

## 2023-07-07 ENCOUNTER — Telehealth: Payer: Self-pay | Admitting: *Deleted

## 2023-07-07 MED ORDER — FERROUS SULFATE 324 MG PO TBEC: 324.0000 mg | DELAYED_RELEASE_TABLET | Freq: Every day | ORAL | 3 refills | Status: AC

## 2023-07-07 NOTE — Telephone Encounter (Signed)
-----   Message from Chris Rodriguez sent at 07/06/2023  9:29 AM EDT ----- Please let Chris Rodriguez know that his labs showed evidence of iron deficiency.  This is the likely cause of his high platelets.  There is also some mild inflammation.  I would recommend that he start ferrous sulfate 325 mg p.o. daily with a source of vitamin C.  Please schedule him to see [place] back in 3 months with labs. ----- Message ----- From: Leory Plowman, Lab In [place] Sent: 07/04/2023   2:36 PM EDT To: Jaci Standard, MD

## 2023-07-07 NOTE — Telephone Encounter (Signed)
 TCT patient regarding recent lab results. No answer but was able to leave detailed message on his identified vm. Advised that his labs showed evidence of iron deficiency. This is the likely cause of his high platelets. There is also some mild inflammation. Dr. Leonides Schanz recommends that he start ferrous sulfate 325 mg p.o. daily with a source of vitamin C. Advised we will schedule him  to see [place] back in 3 months.  Ferrous sulfate escribed to his pharmacy Scheduling message sent for 3 month appt, labs 1 week prior to clinic visit.

## 2023-09-20 ENCOUNTER — Other Ambulatory Visit: Payer: Self-pay | Admitting: Hematology and Oncology

## 2023-09-20 DIAGNOSIS — D500 Iron deficiency anemia secondary to blood loss (chronic): Secondary | ICD-10-CM

## 2023-09-21 ENCOUNTER — Inpatient Hospital Stay: Attending: Hematology and Oncology

## 2023-09-27 ENCOUNTER — Telehealth: Payer: Self-pay | Admitting: Hematology and Oncology

## 2023-09-27 NOTE — Telephone Encounter (Signed)
 Scheduled appointment per 6/23 secure chat. Talked with the patient and he is aware of the made appointment.

## 2023-09-28 ENCOUNTER — Other Ambulatory Visit

## 2023-09-28 ENCOUNTER — Inpatient Hospital Stay: Admitting: Hematology and Oncology

## 2023-09-28 ENCOUNTER — Inpatient Hospital Stay

## 2023-09-28 ENCOUNTER — Ambulatory Visit: Admitting: Hematology and Oncology

## 2023-09-28 NOTE — Progress Notes (Deleted)
 [facility] Telephone:([phone]   Fax:([phone]  PROGRESS NOTE  Patient Care Team: Catalina Bare, MD as PCP - General (Internal Medicine)  Hematological/Oncological History # ***  Interval History:  Chris Rodriguez 33 y.o. male with medical history significant for *** presents for a follow up visit. The patient's last visit was on ***. In the interim since the last visit ***  MEDICAL HISTORY:  Past Medical History:  Diagnosis Date   Hyperlipidemia    Hypertriglyceridemia     SURGICAL HISTORY: No past surgical history on file.  SOCIAL HISTORY: Social History   Socioeconomic History   Marital status: Single    Spouse name: Not on file   Number of children: Not on file   Years of education: Not on file   Highest education level: Not on file  Occupational History   Not on file  Tobacco Use   Smoking status: Never   Smokeless tobacco: Never  Vaping Use   Vaping status: Never Used  Substance and Sexual Activity   Alcohol use: Never   Drug use: Never   Sexual activity: Not on file  Other Topics Concern   Not on file  Social History Narrative   ** Merged History Encounter **       Social Drivers of Health   Financial Resource Strain: Not on file  Food Insecurity: No Food Insecurity (05/29/2022)   Hunger Vital Sign    Worried About Running Out of Food in the Last Year: Never true    Ran Out of Food in the Last Year: Never true  Transportation Needs: No Transportation Needs (05/29/2022)   PRAPARE - Administrator, Civil Service (Medical): No    Lack of Transportation (Non-Medical): No  Physical Activity: Not on file  Stress: Not on file  Social Connections: Not on file  Intimate Partner Violence: Not At Risk (05/29/2022)   Humiliation, Afraid, Rape, and Kick questionnaire    Fear of Current or Ex-Partner: No    Emotionally Abused: No    Physically Abused: No    Sexually Abused: No    FAMILY  HISTORY: No family history on file.  ALLERGIES:  has no allergies on file.  MEDICATIONS:  Current Outpatient Medications  Medication Sig Dispense Refill   ferrous sulfate  324 MG TBEC Take 1 tablet (324 mg total) by mouth daily with breakfast. Take with a source of Vitamin C 30 tablet 3   No current facility-administered medications for this visit.    REVIEW OF SYSTEMS:   Constitutional: ( - ) fevers, ( - )  chills , ( - ) night sweats Eyes: ( - ) blurriness of vision, ( - ) double vision, ( - ) watery eyes Ears, nose, mouth, throat, and face: ( - ) mucositis, ( - ) sore throat Respiratory: ( - ) cough, ( - ) dyspnea, ( - ) wheezes Cardiovascular: ( - ) palpitation, ( - ) chest discomfort, ( - ) lower extremity swelling Gastrointestinal:  ( - ) nausea, ( - ) heartburn, ( - ) change in bowel habits Skin: ( - ) abnormal skin rashes Lymphatics: ( - ) new lymphadenopathy, ( - ) easy bruising Neurological: ( - ) numbness, ( - ) tingling, ( - ) new weaknesses Behavioral/Psych: ( - ) mood change, ( - ) new changes  All other systems were reviewed with the patient and are negative.  PHYSICAL EXAMINATION: ECOG PERFORMANCE STATUS: {CHL ONC ECOG [id number]}  There were no  vitals filed for this visit. There were no vitals filed for this visit.  GENERAL: alert, no distress and comfortable SKIN: skin color, texture, turgor are normal, no rashes or significant lesions EYES: conjunctiva are pink and non-injected, sclera clear OROPHARYNX: no exudate, no erythema; lips, buccal mucosa, and tongue normal  NECK: supple, non-tender LYMPH:  no palpable lymphadenopathy in the cervical, axillary or inguinal LUNGS: clear to auscultation and percussion with normal breathing effort HEART: regular rate & rhythm and no murmurs and no lower extremity edema ABDOMEN: soft, non-tender, non-distended, normal bowel sounds Musculoskeletal: no cyanosis of digits and no clubbing  PSYCH: alert & oriented x 3,  fluent speech NEURO: no focal motor/sensory deficits  LABORATORY DATA:  I have reviewed the data as listed    Latest Ref Rng & Units 06/27/2023    2:51 PM  CBC  WBC 4.0 - 10.5 K/uL 6.1   Hemoglobin 13.0 - 17.0 g/dL 85.9   Hematocrit 60.9 - 52.0 % 42.9   Platelets 150 - 400 K/uL 425        Latest Ref Rng & Units 06/27/2023    2:51 PM  CMP  Glucose 70 - 99 mg/dL 96   BUN 6 - 20 mg/dL 13   Creatinine 9.38 - 1.24 mg/dL 9.17   Sodium 864 - 854 mmol/L 137   Potassium 3.5 - 5.1 mmol/L 3.9   Chloride 98 - 111 mmol/L 102   CO2 22 - 32 mmol/L 31   Calcium 8.9 - 10.3 mg/dL 9.6   Total Protein 6.5 - 8.1 g/dL 7.8   Total Bilirubin 0.0 - 1.2 mg/dL 0.5   Alkaline Phos 38 - 126 U/L 55   AST 15 - 41 U/L 18   ALT 0 - 44 U/L 20     No results found for: MPROTEIN No results found for: KPAFRELGTCHN, LAMBDASER, KAPLAMBRATIO   BLOOD FILM: *** Review of the peripheral blood smear showed normal appearing white cells with neutrophils that were appropriately lobated and granulated. There was no predominance of bi-lobed or hyper-segmented neutrophils appreciated. No Dohle bodies were noted. There was no left shifting, immature forms or blasts noted. Lymphocytes remain normal in size without any predominance of large granular lymphocytes. Red cells show no anisopoikilocytosis, macrocytes , microcytes or polychromasia. There were no schistocytes, target cells, echinocytes, acanthocytes, dacrocytes, or stomatocytes.There was no rouleaux formation, nucleated red cells, or intra-cellular inclusions noted. The platelets are normal in size, shape, and color without any clumping evident.  RADIOGRAPHIC STUDIES: I have personally reviewed the radiological images as listed and agreed with the findings in the report. No results found.  ASSESSMENT & PLAN ***  No orders of the defined types were placed in this encounter.   All questions were answered. The patient knows to call the clinic with any  problems, questions or concerns.  A total of more than 30 minutes were spent on this encounter with face-to-face time and non-face-to-face time, including preparing to see the patient, ordering tests and/or medications, counseling the patient and coordination of care as outlined above.   Norleen IVAR Kidney, MD Department of Hematology/Oncology [facility] Phone: [phone] Pager: [phone] Email: [email or web address]  09/28/2023 7:39 AM
